# Patient Record
Sex: Male | Born: 1990 | Race: Black or African American | Hispanic: No | Marital: Single | Smoking: Current every day smoker
Health system: Southern US, Community
[De-identification: ages and names within clinical notes are randomized; demographics above are authoritative.]

---
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
## 2014-07-02 ENCOUNTER — Encounter (HOSPITAL_COMMUNITY): Payer: Self-pay | Admitting: Emergency Medicine

## 2014-07-02 ENCOUNTER — Emergency Department (HOSPITAL_COMMUNITY)
Admission: EM | Admit: 2014-07-02 | Discharge: 2014-07-02 | Disposition: A | Discharge status: Home / Self Care | Payer: Medicaid Other | Attending: Emergency Medicine | Admitting: Emergency Medicine

## 2014-07-02 DIAGNOSIS — T678XXA Other effects of heat and light, initial encounter: Secondary | ICD-10-CM | POA: Diagnosis not present

## 2014-07-02 DIAGNOSIS — R369 Urethral discharge, unspecified: Secondary | ICD-10-CM

## 2014-07-02 DIAGNOSIS — T679XXA Effect of heat and light, unspecified, initial encounter: Secondary | ICD-10-CM

## 2014-07-02 DIAGNOSIS — F17200 Nicotine dependence, unspecified, uncomplicated: Secondary | ICD-10-CM | POA: Insufficient documentation

## 2014-07-02 DIAGNOSIS — Y9389 Activity, other specified: Secondary | ICD-10-CM | POA: Insufficient documentation

## 2014-07-02 DIAGNOSIS — Y9289 Other specified places as the place of occurrence of the external cause: Secondary | ICD-10-CM | POA: Diagnosis not present

## 2014-07-02 DIAGNOSIS — X30XXXA Exposure to excessive natural heat, initial encounter: Secondary | ICD-10-CM | POA: Diagnosis not present

## 2014-07-02 LAB — I-STAT CHEM 8, ED
BUN: 15 mg/dL (ref 6–23)
Calcium, Ion: 1.14 mmol/L (ref 1.12–1.23)
Chloride: 103 mEq/L (ref 96–112)
Creatinine, Ser: 1.1 mg/dL (ref 0.50–1.35)
Glucose, Bld: 82 mg/dL (ref 70–99)
HCT: 47 % (ref 39.0–52.0)
Hemoglobin: 16 g/dL (ref 13.0–17.0)
Potassium: 3.9 mEq/L (ref 3.7–5.3)
Sodium: 140 mEq/L (ref 137–147)
TCO2: 26 mmol/L (ref 0–100)

## 2014-07-02 LAB — RPR

## 2014-07-02 MED ORDER — CEFTRIAXONE SODIUM 250 MG IJ SOLR
250.0000 mg | Freq: Once | INTRAMUSCULAR | Status: AC
  Administered 2014-07-02: 250 mg via INTRAMUSCULAR
  Filled 2014-07-02: qty 250

## 2014-07-02 MED ORDER — AZITHROMYCIN 250 MG PO TABS
1000.0000 mg | ORAL_TABLET | Freq: Once | ORAL | Status: AC
  Administered 2014-07-02: 1000 mg via ORAL
  Filled 2014-07-02: qty 4

## 2014-07-02 MED ORDER — METRONIDAZOLE 500 MG PO TABS
2000.0000 mg | ORAL_TABLET | Freq: Once | ORAL | Status: AC
  Administered 2014-07-02: 2000 mg via ORAL
  Filled 2014-07-02: qty 4

## 2014-07-02 MED ORDER — STERILE WATER FOR INJECTION IJ SOLN
INTRAMUSCULAR | Status: AC
  Administered 2014-07-02: 10 mL
  Filled 2014-07-02: qty 10

## 2014-07-02 MED ORDER — SODIUM CHLORIDE 0.9 % IV BOLUS (SEPSIS)
1000.0000 mL | Freq: Once | INTRAVENOUS | Status: AC
  Administered 2014-07-02: 1000 mL via INTRAVENOUS

## 2014-07-02 NOTE — ED Provider Notes (Signed)
CSN: [id number]     Arrival date & time 07/02/14  1311 History   First MD Initiated Contact with Patient 07/02/14 1318     Chief Complaint  Patient presents with  . Penile Discharge  . Headache     (Consider location/radiation/quality/duration/timing/severity/associated sxs/prior Treatment) HPI Patient presents to the emergency department with penile discharge, and dizziness.  The patient, states he was walking outside today when he got dizzy.  Patient, states he was walking to the clinic to have his penile discharge.  Examine the patient, states he has not had any chest pain, shortness of breath, back pain, neck pain, nausea, vomiting, diarrhea, fever, abdominal pain, or syncope.  The patient, states, that penile discharge been ongoing for several days.  The patient, states he has had recent unprotected sexual intercourse History reviewed. No pertinent past medical history. History reviewed. No pertinent past surgical history. History reviewed. No pertinent family history. History  Substance Use Topics  . Smoking status: Current Every Day Smoker    Types: Cigarettes  . Smokeless tobacco: Not on file  . Alcohol Use: Not on file    Review of Systems   All other systems negative except as documented in the HPI. All pertinent positives and negatives as reviewed in the HPI. Allergies  Review of patient's allergies indicates no known allergies.  Home Medications   Prior to Admission medications   Not on File   BP 138/75  Pulse 83  Temp(Src) 99 F (37.2 C) (Oral)  Resp 16  SpO2 100% Physical Exam  Nursing note and vitals reviewed. Constitutional: He is oriented to person, place, and time. He appears well-developed and well-nourished. No distress.  HENT:  Head: Normocephalic and atraumatic.  Mouth/Throat: Oropharynx is clear and moist.  Eyes: Pupils are equal, round, and reactive to light.  Neck: Normal range of motion. Neck supple.  Cardiovascular: Normal rate, regular  rhythm and normal heart sounds.  Exam reveals no gallop and no friction rub.   No murmur heard. Pulmonary/Chest: Effort normal and breath sounds normal. No respiratory distress.  Abdominal: Soft. Bowel sounds are normal. He exhibits no distension. There is no tenderness.  Genitourinary: Testes normal. Circumcised. Discharge found.  Neurological: He is alert and oriented to person, place, and time. He exhibits normal muscle tone. Coordination normal.  Skin: Skin is warm and dry. No rash noted. No erythema.    ED Course  Procedures (including critical care time) Labs Review Labs Reviewed  GC/CHLAMYDIA PROBE AMP  RPR  I-STAT CHEM 8, ED     Patient will be treated for his penile discharge.  He is given IV fluids and his vital signs been stable here in the emergency department until the patient got a little overheated by walking outdoors   WellPointChristopher W Emanie Behan, PA-C 07/02/14 1523

## 2014-07-02 NOTE — Progress Notes (Signed)
[facility] provided pt with a list of primary care resources to help patient establish primary care.  °

## 2014-07-02 NOTE — Discharge Instructions (Signed)
Return here as needed.  Followup with the health department as needed.  Increase your fluid intake, rest as much possible

## 2014-07-02 NOTE — ED Notes (Addendum)
Pt c/o of penile discharge, headache, and dizziness. States that he constantly ejaculates randomly. States that he was on the way to the clinic and started feeling dizzy.

## 2014-07-02 NOTE — ED Notes (Signed)
Bed: NW29WA04 Expected date:  Expected time:  Means of arrival:  Comments: EMS 66M

## 2014-07-03 LAB — GC/CHLAMYDIA PROBE AMP
CT Probe RNA: NEGATIVE
GC Probe RNA: POSITIVE — AB

## 2014-07-03 NOTE — ED Provider Notes (Signed)
Medical screening examination/treatment/procedure(s) were performed by non-physician practitioner and as supervising physician I was immediately available for consultation/collaboration.   EKG Interpretation None       Mattisen Pohlmann R. Judaea Burgoon, MD 07/03/14 0809 

## 2014-07-04 ENCOUNTER — Telehealth (HOSPITAL_BASED_OUTPATIENT_CLINIC_OR_DEPARTMENT_OTHER): Payer: Self-pay | Admitting: Emergency Medicine

## 2014-07-04 NOTE — Telephone Encounter (Signed)
+  Gonorrhea. Patient treated with Rocephin and Zithromax. [employer] faxed. 

## 2014-07-06 ENCOUNTER — Telehealth (HOSPITAL_COMMUNITY): Payer: Self-pay
